# Patient Record
Sex: Male | Born: 1950 | Hispanic: No | Smoking: Former smoker
Health system: Southern US, Community
[De-identification: ages and names within clinical notes are randomized; demographics above are authoritative.]

## PROBLEM LIST (undated history)

## (undated) DIAGNOSIS — K5190 Ulcerative colitis, unspecified, without complications: Secondary | ICD-10-CM

## (undated) DIAGNOSIS — E119 Type 2 diabetes mellitus without complications: Secondary | ICD-10-CM

## (undated) DIAGNOSIS — I10 Essential (primary) hypertension: Secondary | ICD-10-CM

## (undated) HISTORY — PX: CHOLECYSTECTOMY: SHX55

## (undated) HISTORY — PX: BACK SURGERY: SHX140

---

## 2001-12-27 ENCOUNTER — Emergency Department (HOSPITAL_COMMUNITY): Admission: EM | Admit: 2001-12-27 | Discharge: 2001-12-27 | Payer: Self-pay

## 2002-04-24 ENCOUNTER — Encounter: Payer: Self-pay | Admitting: Emergency Medicine

## 2002-04-24 ENCOUNTER — Inpatient Hospital Stay (HOSPITAL_COMMUNITY): Admission: EM | Admit: 2002-04-24 | Discharge: 2002-04-26 | Payer: Self-pay | Admitting: Emergency Medicine

## 2002-04-25 ENCOUNTER — Encounter: Payer: Self-pay | Admitting: General Surgery

## 2018-07-16 ENCOUNTER — Encounter: Payer: Self-pay | Admitting: Emergency Medicine

## 2018-07-16 ENCOUNTER — Emergency Department: Payer: Medicare Other

## 2018-07-16 ENCOUNTER — Emergency Department
Admission: EM | Admit: 2018-07-16 | Discharge: 2018-07-17 | Disposition: A | Discharge status: Home / Self Care | Payer: Medicare Other | Attending: Emergency Medicine | Admitting: Emergency Medicine

## 2018-07-16 ENCOUNTER — Other Ambulatory Visit: Payer: Self-pay

## 2018-07-16 DIAGNOSIS — I10 Essential (primary) hypertension: Secondary | ICD-10-CM | POA: Insufficient documentation

## 2018-07-16 DIAGNOSIS — H532 Diplopia: Secondary | ICD-10-CM | POA: Insufficient documentation

## 2018-07-16 DIAGNOSIS — E119 Type 2 diabetes mellitus without complications: Secondary | ICD-10-CM | POA: Insufficient documentation

## 2018-07-16 DIAGNOSIS — Z87891 Personal history of nicotine dependence: Secondary | ICD-10-CM | POA: Insufficient documentation

## 2018-07-16 HISTORY — DX: Type 2 diabetes mellitus without complications: E11.9

## 2018-07-16 HISTORY — DX: Essential (primary) hypertension: I10

## 2018-07-16 HISTORY — DX: Ulcerative colitis, unspecified, without complications: K51.90

## 2018-07-16 LAB — CBC WITH DIFFERENTIAL/PLATELET
Abs Immature Granulocytes: 0.04 10*3/uL (ref 0.00–0.07)
BASOS ABS: 0.1 10*3/uL (ref 0.0–0.1)
Basophils Relative: 1 %
EOS ABS: 0.3 10*3/uL (ref 0.0–0.5)
Eosinophils Relative: 4 %
HCT: 42.6 % (ref 39.0–52.0)
Hemoglobin: 14.1 g/dL (ref 13.0–17.0)
IMMATURE GRANULOCYTES: 1 %
Lymphocytes Relative: 36 %
Lymphs Abs: 2.5 10*3/uL (ref 0.7–4.0)
MCH: 28.9 pg (ref 26.0–34.0)
MCHC: 33.1 g/dL (ref 30.0–36.0)
MCV: 87.3 fL (ref 80.0–100.0)
Monocytes Absolute: 0.7 10*3/uL (ref 0.1–1.0)
Monocytes Relative: 10 %
NEUTROS PCT: 48 %
NRBC: 0 % (ref 0.0–0.2)
Neutro Abs: 3.4 10*3/uL (ref 1.7–7.7)
PLATELETS: 174 10*3/uL (ref 150–400)
RBC: 4.88 MIL/uL (ref 4.22–5.81)
RDW: 13.9 % (ref 11.5–15.5)
WBC: 6.9 10*3/uL (ref 4.0–10.5)

## 2018-07-16 LAB — URINALYSIS, COMPLETE (UACMP) WITH MICROSCOPIC
BILIRUBIN URINE: NEGATIVE
Bacteria, UA: NONE SEEN
Glucose, UA: NEGATIVE mg/dL
Hgb urine dipstick: NEGATIVE
Ketones, ur: NEGATIVE mg/dL
Leukocytes, UA: NEGATIVE
Nitrite: NEGATIVE
PH: 5 (ref 5.0–8.0)
Protein, ur: NEGATIVE mg/dL
SQUAMOUS EPITHELIAL / LPF: NONE SEEN (ref 0–5)
Specific Gravity, Urine: 1.019 (ref 1.005–1.030)

## 2018-07-16 LAB — COMPREHENSIVE METABOLIC PANEL
ALT: 31 U/L (ref 0–44)
AST: 22 U/L (ref 15–41)
Albumin: 4.2 g/dL (ref 3.5–5.0)
Alkaline Phosphatase: 40 U/L (ref 38–126)
Anion gap: 9 (ref 5–15)
BUN: 18 mg/dL (ref 8–23)
CHLORIDE: 105 mmol/L (ref 98–111)
CO2: 27 mmol/L (ref 22–32)
Calcium: 9 mg/dL (ref 8.9–10.3)
Creatinine, Ser: 0.83 mg/dL (ref 0.61–1.24)
GFR calc Af Amer: >60 mL/min (ref >60)
Glucose, Bld: 148 mg/dL — ABNORMAL HIGH (ref 70–99)
POTASSIUM: 3.4 mmol/L — AB (ref 3.5–5.1)
SODIUM: 141 mmol/L (ref 135–145)
Total Bilirubin: 0.4 mg/dL (ref 0.3–1.2)
Total Protein: 7.4 g/dL (ref 6.5–8.1)

## 2018-07-16 LAB — GLUCOSE, CAPILLARY: Glucose-Capillary: 130 mg/dL — ABNORMAL HIGH (ref 70–99)

## 2018-07-16 LAB — TROPONIN I

## 2018-07-16 NOTE — ED Notes (Signed)
Spoke with Dr Quentin Cornwall and verbal orders obtained

## 2018-07-16 NOTE — ED Triage Notes (Signed)
Patient ambulatory to triage with steady gait, without difficulty or distress noted; pt reports double vision since 5am; pt denies any c/o pain

## 2018-07-16 NOTE — ED Notes (Signed)
Visual Acuity (Distance): Bilateral:20/20 Left:20/25 Right:20/25

## 2018-07-17 ENCOUNTER — Emergency Department: Payer: Medicare Other

## 2018-07-17 DIAGNOSIS — H532 Diplopia: Secondary | ICD-10-CM | POA: Diagnosis not present

## 2018-07-17 MED ORDER — GADOBUTROL 1 MMOL/ML IV SOLN
12.0000 mL | Freq: Once | INTRAVENOUS | Status: AC | PRN
  Administered 2018-07-17: 12 mL via INTRAVENOUS

## 2018-07-17 NOTE — ED Notes (Signed)
Patient transported to MRI 

## 2018-07-17 NOTE — ED Provider Notes (Signed)
[facility] Emergency Department Provider Note   First MD Initiated Contact with Patient 07/16/18 2347     (approximate)  I have reviewed the triage vital signs and the nursing notes.   HISTORY  Chief Complaint Diplopia    HPI DEMPSY DAMIANO is a 67 y.o. male with history of hypertension diabetes and ulcerative colitis presents to the emergency department with history of awakening at 4:30 AM yesterday morning with double vision.  Patient states double vision is only present when he looks at an object with both eyes however when he covers one eye vision is normal on both sides.  Patient denies any headache at present however states that he had a headache 2 days ago which resolved.  Patient denies any weakness numbness gait instability.  Patient denies any nausea or vomiting   Past Medical History:  Diagnosis Date  . Diabetes mellitus without complication ([place])   . Hypertension   . Ulcerative colitis ([place])     There are no active problems to display for this patient.   Past Surgical History:  Procedure Laterality Date  . BACK SURGERY    . CHOLECYSTECTOMY      Prior to Admission medications   Not on File    Allergies No known drug allergies No family history on file.  Social History Social History   Tobacco Use  . Smoking status: Former Research scientist (life sciences)  . Smokeless tobacco: Never Used  Substance Use Topics  . Alcohol use: Not on file  . Drug use: Not on file    Review of Systems Constitutional: No fever/chills Eyes: No visual changes. ENT: No sore throat. Cardiovascular: Denies chest pain. Respiratory: Denies shortness of breath. Gastrointestinal: No abdominal pain.  No nausea, no vomiting.  No diarrhea.  No constipation. Genitourinary: Negative for dysuria. Musculoskeletal: Negative for neck pain.  Negative for back pain. Integumentary: Negative for rash. Neurological: Negative for headaches, focal weakness or numbness.  Positive for  diplopia   ____________________________________________   PHYSICAL EXAM:  VITAL SIGNS: ED Triage Vitals  Enc Vitals Group     BP 07/16/18 2112 (!) 160/75     Pulse Rate 07/16/18 2112 80     Resp 07/16/18 2112 16     Temp 07/16/18 2112 98 F (36.7 C)     Temp Source 07/16/18 2112 Oral     SpO2 07/16/18 2112 96 %     Weight 07/16/18 2120 120.2 kg (265 lb)     Height 07/16/18 2120 1.803 m (5' 11" )     Head Circumference --      Peak Flow --      Pain Score 07/16/18 2120 0     Pain Loc --      Pain Edu? --      Excl. in [place]? --     Constitutional: Alert and oriented. Well appearing and in no acute distress. Eyes: Conjunctivae are normal. PERRL. EOMI. Head: Atraumatic. Mouth/Throat: Mucous membranes are moist.  Oropharynx non-erythematous. Neck: No stridor.   Cardiovascular: Normal rate, regular rhythm. Good peripheral circulation. Grossly normal heart sounds. Respiratory: Normal respiratory effort.  No retractions. Lungs CTAB. Gastrointestinal: Soft and nontender. No distention.  Musculoskeletal: No lower extremity tenderness nor edema. No gross deformities of extremities. Neurologic:  Normal speech and language. No gross focal neurologic deficits are appreciated.  Skin:  Skin is warm, dry and intact. No rash noted. Psychiatric: Mood and affect are normal. Speech and behavior are normal.  ____________________________________________   LABS (all labs  ordered are listed, but only abnormal results are displayed)  Labs Reviewed  COMPREHENSIVE METABOLIC PANEL - Abnormal; Notable for the following components:      Result Value   Potassium 3.4 (*)    Glucose, Bld 148 (*)    All other components within normal limits  URINALYSIS, COMPLETE (UACMP) WITH MICROSCOPIC - Abnormal; Notable for the following components:   Color, Urine YELLOW (*)    APPearance CLEAR (*)    All other components within normal limits  GLUCOSE, CAPILLARY - Abnormal; Notable for the following components:     Glucose-Capillary 130 (*)    All other components within normal limits  CBC WITH DIFFERENTIAL/PLATELET  TROPONIN I  CBG MONITORING, ED   ____________________________________________  EKG  ED ECG REPORT I, [place] N Baltasar Twilley, the attending physician, personally viewed and interpreted this ECG.   Date: 07/17/2018  EKG Time: 9:55 PM  Rate: 82  Rhythm: Normal sinus rhythm  Axis: Normal  Intervals:Normal  ST&T Change: None  ____________________________________________  RADIOLOGY I, [place] N Kaetlin Bullen, personally viewed and evaluated these images (plain radiographs) as part of my medical decision making, as well as reviewing the written report by the radiologist.  ED MD interpretation:    Official radiology report(s): Ct Head Wo Contrast  Result Date: 07/16/2018 CLINICAL DATA:  Diplopia EXAM: CT HEAD WITHOUT CONTRAST TECHNIQUE: Contiguous axial images were obtained from the base of the skull through the vertex without intravenous contrast. COMPARISON:  None. FINDINGS: Brain: There is no mass, hemorrhage or extra-axial collection. The size and configuration of the ventricles and extra-axial CSF spaces are normal. There is no acute or chronic infarction. The brain parenchyma is normal. Vascular: No abnormal hyperdensity of the major intracranial arteries or dural venous sinuses. No intracranial atherosclerosis. Skull: The visualized skull base, calvarium and extracranial soft tissues are normal. Sinuses/Orbits: No fluid levels or advanced mucosal thickening of the visualized paranasal sinuses. No mastoid or middle ear effusion. The orbits are normal. IMPRESSION: Normal brain. Electronically Signed   By: Ulyses Jarred M.D.   On: 07/16/2018 21:57   Mr Brain W And Wo Contrast  Result Date: 07/17/2018 CLINICAL DATA:  Diplopia EXAM: MRI HEAD WITHOUT AND WITH CONTRAST TECHNIQUE: Multiplanar, multiecho pulse sequences of the brain and surrounding structures were obtained without and with  intravenous contrast. CONTRAST:  12 mL Gadavist COMPARISON:  Head CT 07/16/2018 FINDINGS: BRAIN: There is no acute infarct, acute hemorrhage, hydrocephalus or extra-axial collection. The midline structures are normal. No midline shift or other mass effect. There are no old infarcts. Multifocal white matter hyperintensity, most commonly due to chronic ischemic microangiopathy. The cerebral and cerebellar volume are age-appropriate. Susceptibility-sensitive sequences show no chronic microhemorrhage or superficial siderosis. No abnormal contrast enhancement. VASCULAR: Major intracranial arterial and venous sinus flow voids are normal. SKULL AND UPPER CERVICAL SPINE: Calvarial bone marrow signal is normal. There is no skull base mass. Visualized upper cervical spine and soft tissues are normal. SINUSES/ORBITS: No fluid levels or advanced mucosal thickening. No mastoid or middle ear effusion. The orbits are normal. IMPRESSION: Mild chronic small vessel disease without acute intracranial abnormality. Electronically Signed   By: Ulyses Jarred M.D.   On: 07/17/2018 02:12     Procedures   ____________________________________________   INITIAL IMPRESSION / ASSESSMENT AND PLAN / ED COURSE  As part of my medical decision making, I reviewed the following data within the electronic MEDICAL RECORD NUMBER   67 year old male presents with above stated history and physical exam secondary to  binocular diplopia.  Concern for possible intracranial pathology CT scan revealed no acute findings however consider possibly of ischemic injury and as such MRI was performed which was also negative.   patient will be referred to ophthalmology Dr. Neville Route for further outpatient evaluation    ____________________________________________  FINAL CLINICAL IMPRESSION(S) / ED DIAGNOSES  Final diagnoses:  Diplopia     MEDICATIONS GIVEN DURING THIS VISIT:  Medications  gadobutrol (GADAVIST) 1 MMOL/ML injection 12 mL (12 mLs  Intravenous Contrast Given 07/17/18 0151)     ED Discharge Orders    None       Note:  This document was prepared using Dragon voice recognition software and may include unintentional dictation errors.    Gregor Hams, MD 07/17/18 [phone]

## 2019-11-17 ENCOUNTER — Ambulatory Visit: Payer: Medicare Other | Attending: Internal Medicine

## 2019-11-17 ENCOUNTER — Other Ambulatory Visit: Payer: Self-pay

## 2019-11-17 DIAGNOSIS — Z23 Encounter for immunization: Secondary | ICD-10-CM

## 2019-11-17 NOTE — Progress Notes (Signed)
   Covid-19 Vaccination Clinic  Name:  Dylan Erickson    MRN: [id number] DOB: 1951-08-28  11/17/2019  Dylan Erickson was observed post Covid-19 immunization for 15 minutes without incidence. He was provided with Vaccine Information Sheet and instruction to access the V-Safe system.   Dylan Erickson was instructed to call 911 with any severe reactions post vaccine: Marland Kitchen Difficulty breathing  . Swelling of your face and throat  . A fast heartbeat  . A bad rash all over your body  . Dizziness and weakness    Immunizations Administered    Name Date Dose VIS Date Route   Moderna COVID-19 Vaccine 11/17/2019  1:54 PM 0.5 mL 09/09/2019 Intramuscular   Manufacturer: Moderna   Lot: [id number]   [place]: [id number]

## 2019-12-17 ENCOUNTER — Ambulatory Visit: Payer: Medicare Other | Attending: Internal Medicine

## 2019-12-17 DIAGNOSIS — Z23 Encounter for immunization: Secondary | ICD-10-CM | POA: Insufficient documentation

## 2019-12-17 NOTE — Progress Notes (Signed)
   Covid-19 Vaccination Clinic  Name:  Dylan Erickson    MRN: [id number] DOB: 1951/06/21  12/17/2019  Mr. Nolden was observed post Covid-19 immunization for 15 minutes without incident. He was provided with Vaccine Information Sheet and instruction to access the V-Safe system.   Mr. Dickerman was instructed to call 911 with any severe reactions post vaccine: Marland Kitchen Difficulty breathing  . Swelling of face and throat  . A fast heartbeat  . A bad rash all over body  . Dizziness and weakness   Immunizations Administered    Name Date Dose VIS Date Route   Moderna COVID-19 Vaccine 12/17/2019  1:00 PM 0.5 mL 09/09/2019 Intramuscular   Manufacturer: Moderna   Lot: [id number]   [place]: [id number]

## 2020-08-08 IMAGING — MR MR HEAD WO/W CM
11 of 12 series · 45 of 48 positions shown · IV contrast (GADOVIST)
Comparison: Head CT 07/16/2018

CLINICAL DATA: Diplopia

EXAM:
MRI HEAD WITHOUT AND WITH CONTRAST
TECHNIQUE: Multiplanar, multiecho pulse sequences of the brain and surrounding
structures were obtained without and with intravenous contrast.
CONTRAST:  12 mL Gadavist

[Series 2: ax dwi_tracew · axial · 3.0mm · 0.83mm/px · z∈[-4,+154]mm · 7 of 55 slices shown]
[im 1/55]
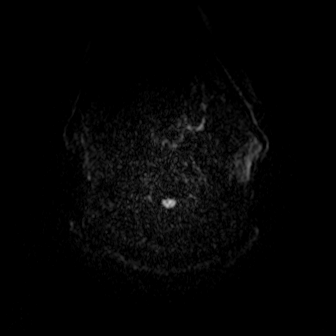
[im 10/55]
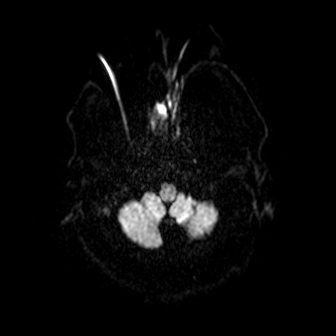
[im 19/55]
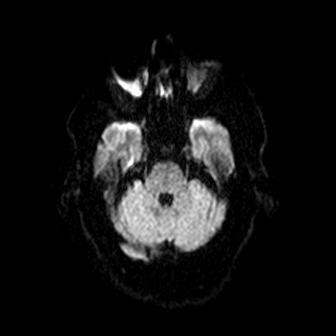
[im 28/55]
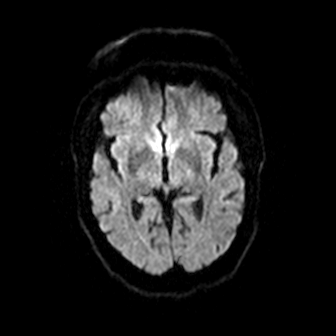
[im 37/55]
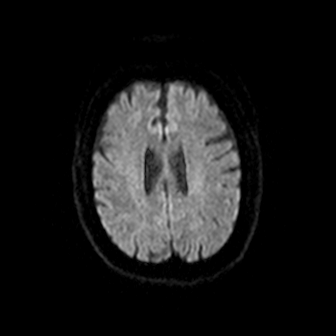
[im 46/55]
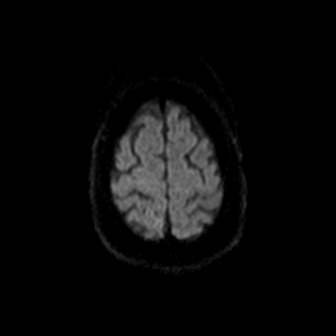
[im 55/55]
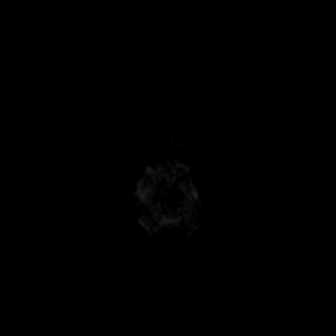

[Series 3: ax dwi_adc · axial · 3.0mm · 0.83mm/px · z∈[-4,+154]mm · 7 of 55 slices shown]
[im 1/55]
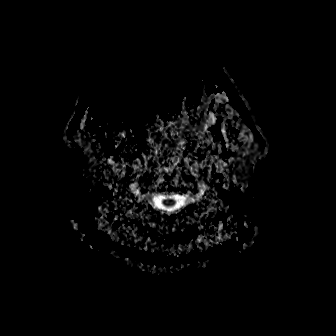
[im 10/55]
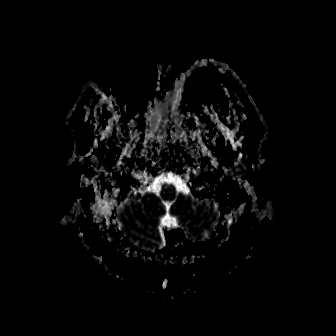
[im 19/55]
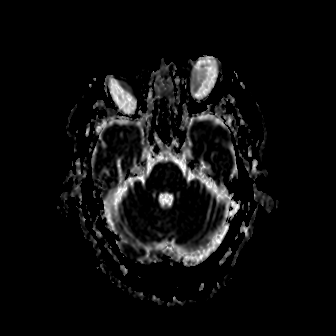
[im 28/55]
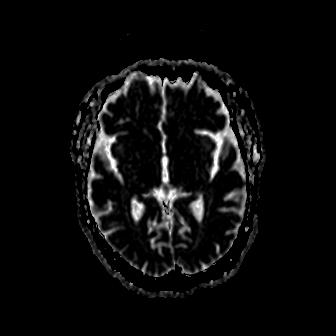
[im 37/55]
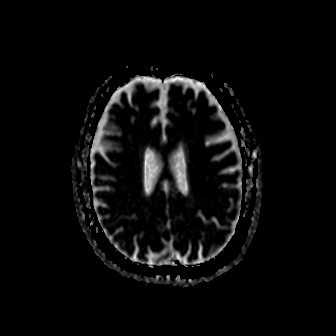
[im 46/55]
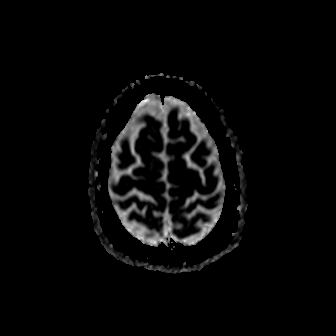
[im 55/55]
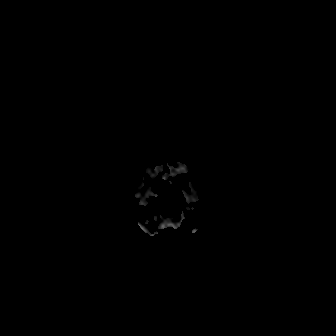

[Series 4: cor dwi_tracew · coronal · 5.0mm · 0.68mm/px · 4 of 36 slices shown]
[im 1/36]
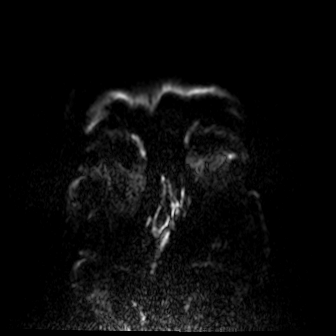
[im 12/36]
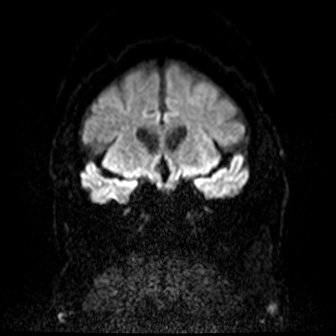
[im 24/36]
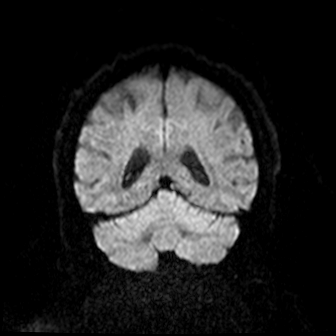
[im 36/36]
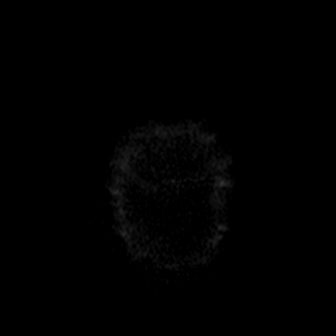

[Series 5: cor dwi_adc · coronal · 5.0mm · 0.68mm/px · 4 of 37 slices shown]
[im 1/37]
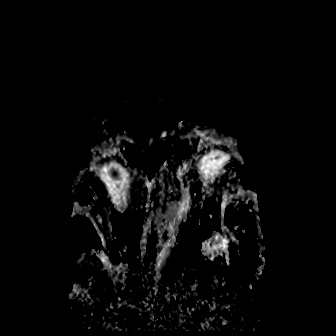
[im 13/37]
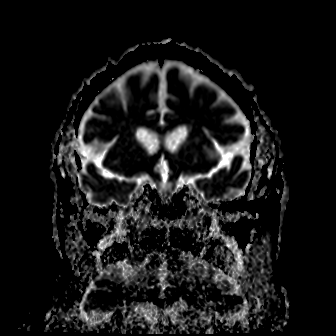
[im 25/37]
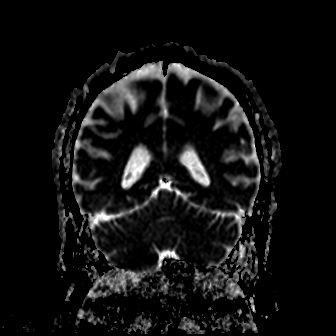
[im 37/37]
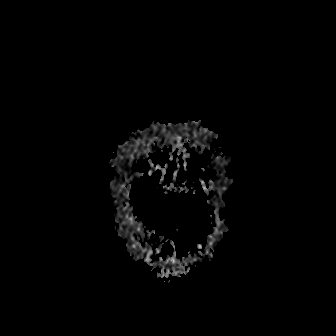

[Series 6: T1 · sagittal · 5.0mm · 0.94mm/px · 3 of 21 slices shown (1 of 2)]
[im 1/21]
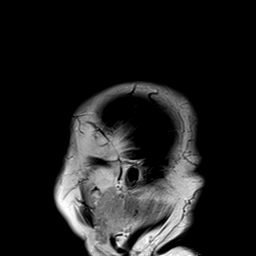
[im 11/21]
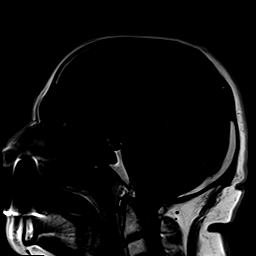
[im 21/21]
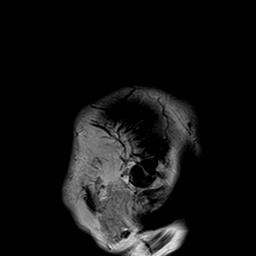

[Series 7: FLAIR · axial · 5.0mm · 1.20mm/px · z∈[-1,+151]mm · 3 of 27 slices shown]
[im 1/27]
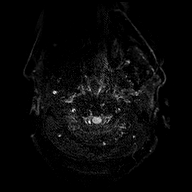
[im 14/27]
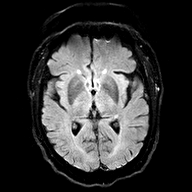
[im 27/27]
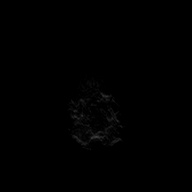

[Series 9: T2 · axial · 5.0mm · 0.45mm/px · z∈[-1,+152]mm · 3 of 27 slices shown (1 of 2)]
[im 1/27]
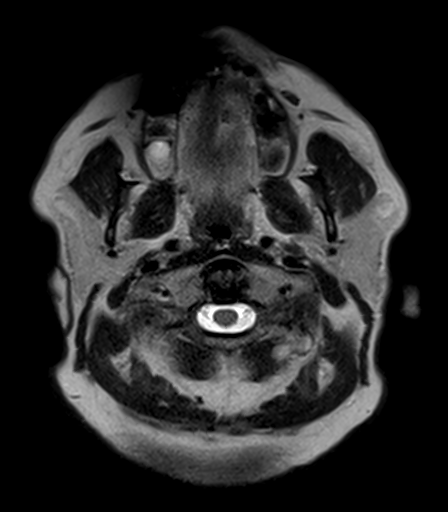
[im 14/27]
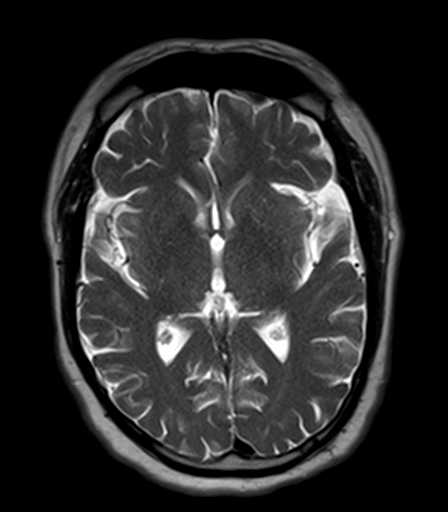
[im 27/27]
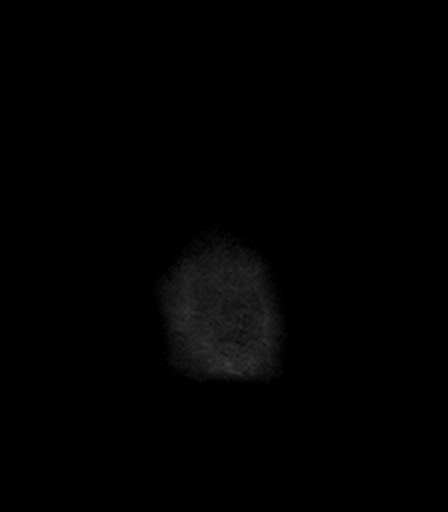

[Series 10: T1 · axial · 5.0mm · 0.90mm/px · z∈[-1,+152]mm · 3 of 27 slices shown (2 of 2)]
[im 1/27]
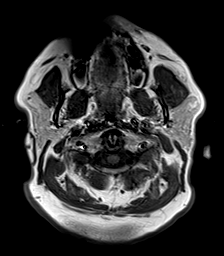
[im 14/27]
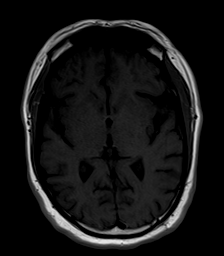
[im 27/27]
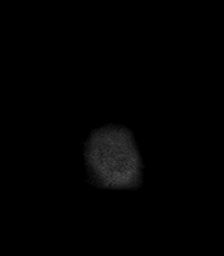

[Series 11: T2 · coronal · 5.0mm · 0.45mm/px · 4 of 30 slices shown (2 of 2)]
[im 1/30]
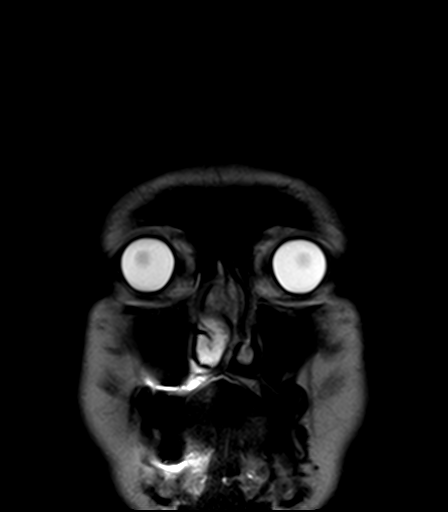
[im 10/30]
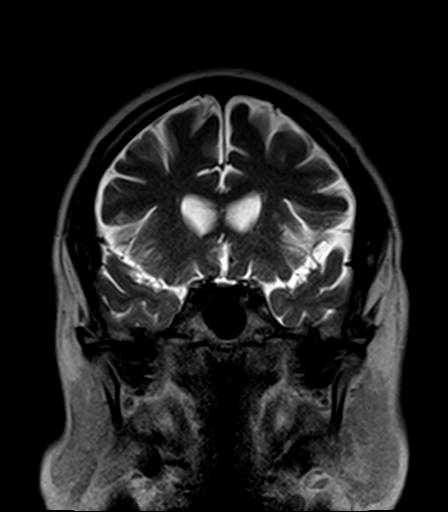
[im 20/30]
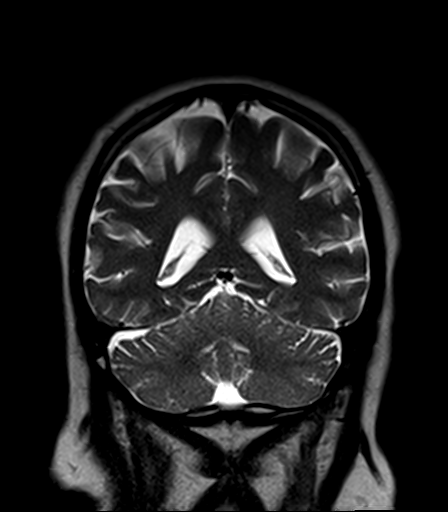
[im 30/30]
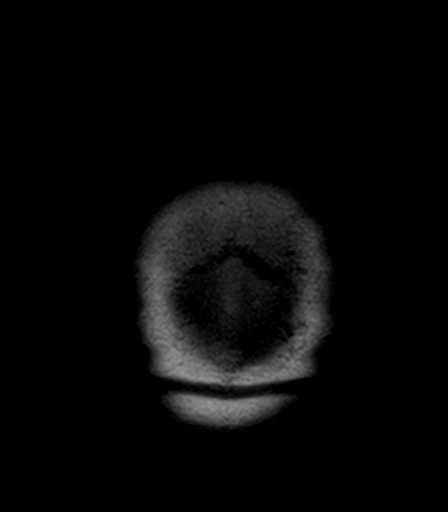

[Series 12: T1 post-contrast · axial · 5.0mm · 0.90mm/px · z∈[-1,+152]mm · 3 of 27 slices shown (1 of 2)]
[im 1/27]
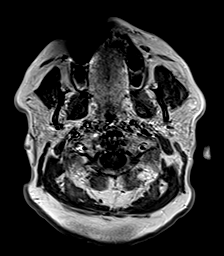
[im 14/27]
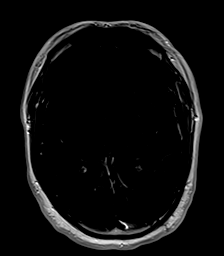
[im 27/27]
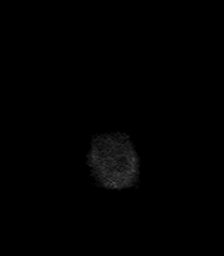

[Series 13: T1 post-contrast · coronal · 5.0mm · 0.90mm/px · 4 of 30 slices shown (2 of 2)]
[im 1/30]
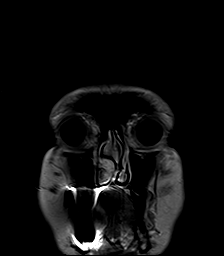
[im 10/30]
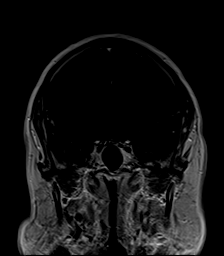
[im 20/30]
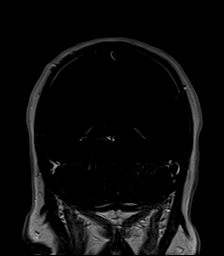
[im 30/30]
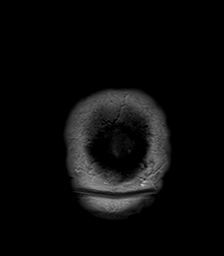

[45 of 48 positions shown; findings below may reference images not displayed]

FINDINGS: BRAIN: There is no acute infarct, acute hemorrhage, hydrocephalus or
extra-axial collection. The midline structures are normal. No
midline shift or other mass effect. There are no old infarcts.
Multifocal white matter hyperintensity, most commonly due to chronic
ischemic microangiopathy. The cerebral and cerebellar volume are
age-appropriate. Susceptibility-sensitive sequences show no chronic
microhemorrhage or superficial siderosis. No abnormal contrast
enhancement.

VASCULAR: Major intracranial arterial and venous sinus flow voids
are normal.

SKULL AND UPPER CERVICAL SPINE: Calvarial bone marrow signal is
normal. There is no skull base mass. Visualized upper cervical spine
and soft tissues are normal.

SINUSES/ORBITS: No fluid levels or advanced mucosal thickening. No
mastoid or middle ear effusion. The orbits are normal.
IMPRESSION: Mild chronic small vessel disease without acute intracranial
abnormality.

## 2023-07-11 ENCOUNTER — Encounter: Payer: Self-pay | Admitting: Podiatry

## 2023-07-11 ENCOUNTER — Ambulatory Visit (INDEPENDENT_AMBULATORY_CARE_PROVIDER_SITE_OTHER): Payer: Medicare Other

## 2023-07-11 ENCOUNTER — Ambulatory Visit (INDEPENDENT_AMBULATORY_CARE_PROVIDER_SITE_OTHER): Payer: Medicare Other | Admitting: Podiatry

## 2023-07-11 DIAGNOSIS — M2041 Other hammer toe(s) (acquired), right foot: Secondary | ICD-10-CM

## 2023-07-11 DIAGNOSIS — L600 Ingrowing nail: Secondary | ICD-10-CM | POA: Diagnosis not present

## 2023-07-11 DIAGNOSIS — L03031 Cellulitis of right toe: Secondary | ICD-10-CM | POA: Diagnosis not present

## 2023-07-11 NOTE — Patient Instructions (Signed)
VISIT SUMMARY:  During your visit, we discussed your concerns about your right second toe and left big toe. Your right second toe, which had surgery a few years ago, had a minor infection a few weeks ago but is now healed. Your left big toe has been causing discomfort due to the shape of the nail. We also reviewed your diabetes management.  YOUR PLAN:  -RIGHT SECOND TOE: Your right second toe, which had surgery a few years ago, is doing well. There is no current infection or pain. We recommend using a silicone cap for protection during high activity.  -LEFT BIG TOE: Your left big toe has been causing discomfort due to the shape of the nail. We have trimmed the nail to help relieve the pain and recommend soaking your foot in Epsom salt and applying Neosporin for two weeks. If the pain returns, we may consider a procedure to remove part of the nail.  -DIABETES: Your diabetes is well-managed with your current treatment plan. We will continue to monitor your blood sugar levels for potential future foot procedures.  INSTRUCTIONS:  Continue to monitor your right second toe for any changes or signs of infection. Soak your left foot in Epsom salt and apply Neosporin for two weeks. Continue with your current diabetes management plan and monitor your blood sugar levels. If you notice any changes or have any concerns, please contact our office.

## 2023-07-11 NOTE — Progress Notes (Signed)
Subjective:  Patient ID: Dylan Erickson, male    DOB: 03-22-1951,  MRN: [id number]  Chief Complaint  Patient presents with   Foot Pain    "I have a Hammer Toe on my right foot, it's getting sore. I have a place on it where pus was coming out.  This big toe on my left foot stays sore all the time in the corner of my nail.  I'm Diabetic."    Discussed the use of AI scribe software for clinical note transcription with the patient, who gave verbal consent to proceed.  History of Present Illness   The patient, with a history of diabetes and a previous right second toe joint removal surgery, presents with concerns about his right second toe and left hallux. He reports a single episode of pus discharge from the right second toenail about three weeks ago, which resolved with peroxide cleaning and antibiotics. He denies any current pain in the right second toe, which remains slightly crooked post-surgery. He also denies any pain in the right big toe, which is stiff due to joint removal.  The patient's left hallux has been causing discomfort due to an incurvated nail. He has been attempting to manage the discomfort by cutting the nail themselves, but the pain persists. He denies any numbness or tingling in the foot and has not been diagnosed with neuropathy.          Objective:    Physical Exam   EXTREMITIES: Palpable pulses, foot warm, well perfused with normal capillary fill time. MUSCULOSKELETAL: Reducible second hammer toe deformity of the right second toe with well-healed surgical scar on the dorsal second toe and first MTP, slight limited range of motion of the first MTP. NEUROLOGICAL: Sensation is normal. SKIN: Healed evidence of previous paronychia that is no longer infected on the second toenail proximal nail fold, and subungual bruising but no active hematoma or bleeding. Left hallux nail has incurvated borders on both sides, pain relieved by slant back debridement, no signs of infection.        No images are attached to the encounter.    Results   Procedure: Nail debridement Description: Nail debridement of the left hallux. The nail was clipped back, and the corner was cleaned out. The nail was curved, and dead skin and cuticle were removed. The area appeared irritated but improved after debridement.  LABS A1c: 7.2      Assessment:   1. Hammer toe of right foot      Plan:  Patient was evaluated and treated and all questions answered.  Assessment and Plan    Right Second Toe Post-Surgical Changes He has a history of joint removal surgery in the right second toe 6-7 years ago and experienced a recent episode of pus drainage from the nail bed, which resolved with home care. Currently, there is no pain or infection. We will continue monitoring and recommend using a silicone cap for protection during high activity.  Left Hallux Nail Deformity He presents with painful incurvated nail borders on the left hallux, with pain relief achieved through slant back debridement and no signs of infection. We will perform Epsom salt soaks and apply Neosporin for two weeks. If pain recurs, we will consider a permanent procedure to remove part of the nail, pending an A1c below 7.5.  Diabetes His A1c is around 7.2, with good foot sensation and circulation. We will continue the current management and monitor A1c for potential future foot procedures.  Return if symptoms worsen or fail to improve.
